# Patient Record
Sex: Female | Born: 1970 | Race: White | Hispanic: No | Marital: Single | Smoking: Never smoker
Health system: Southern US, Community
[De-identification: ages and names within clinical notes are randomized; demographics above are authoritative.]

## PROBLEM LIST (undated history)

## (undated) DIAGNOSIS — K219 Gastro-esophageal reflux disease without esophagitis: Secondary | ICD-10-CM

## (undated) DIAGNOSIS — Z8669 Personal history of other diseases of the nervous system and sense organs: Secondary | ICD-10-CM

---

## 2017-01-31 DIAGNOSIS — R000 Tachycardia, unspecified: Secondary | ICD-10-CM | POA: Diagnosis not present

## 2017-01-31 DIAGNOSIS — R079 Chest pain, unspecified: Secondary | ICD-10-CM | POA: Diagnosis not present

## 2017-01-31 DIAGNOSIS — Z5321 Procedure and treatment not carried out due to patient leaving prior to being seen by health care provider: Secondary | ICD-10-CM | POA: Diagnosis not present

## 2017-01-31 NOTE — ED Notes (Signed)
No response from Pt for vitals re-check at 18:30.

## 2017-01-31 NOTE — ED Triage Notes (Signed)
Pt. Stated, I started having a fast heart rate and some chest pain, went to [facility] and they sent me here.

## 2017-02-01 HISTORY — DX: Personal history of other diseases of the nervous system and sense organs: Z86.69

## 2017-02-01 HISTORY — DX: Gastro-esophageal reflux disease without esophagitis: K21.9

## 2019-05-28 IMAGING — DX DG CHEST 2V
2 series · 2 of 2 positions shown · non-contrast
Comparison: None.

CLINICAL DATA: Left-sided chest pain and heart palpitations.

EXAM:
CHEST  2 VIEW

[w chest pa]
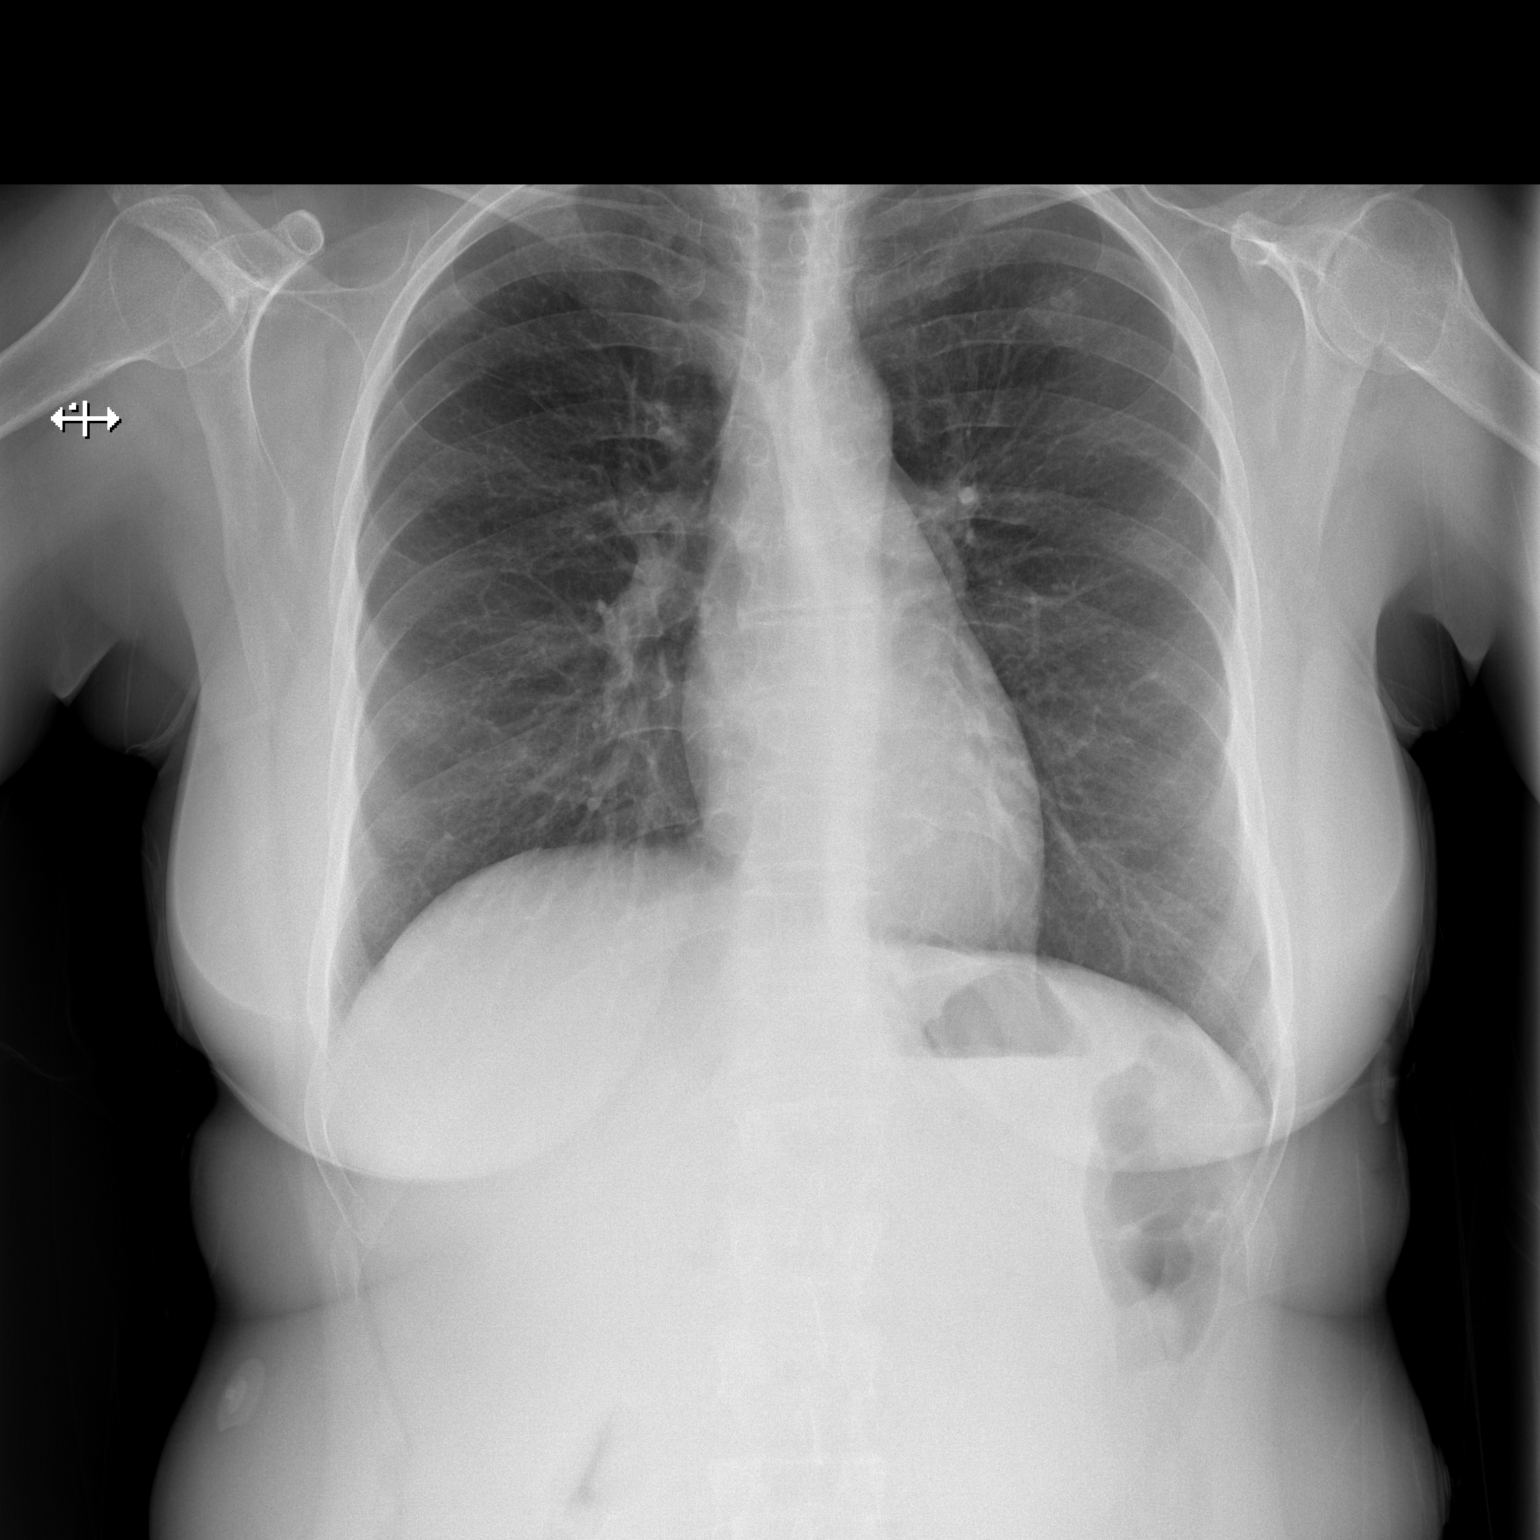

[w chest lat]
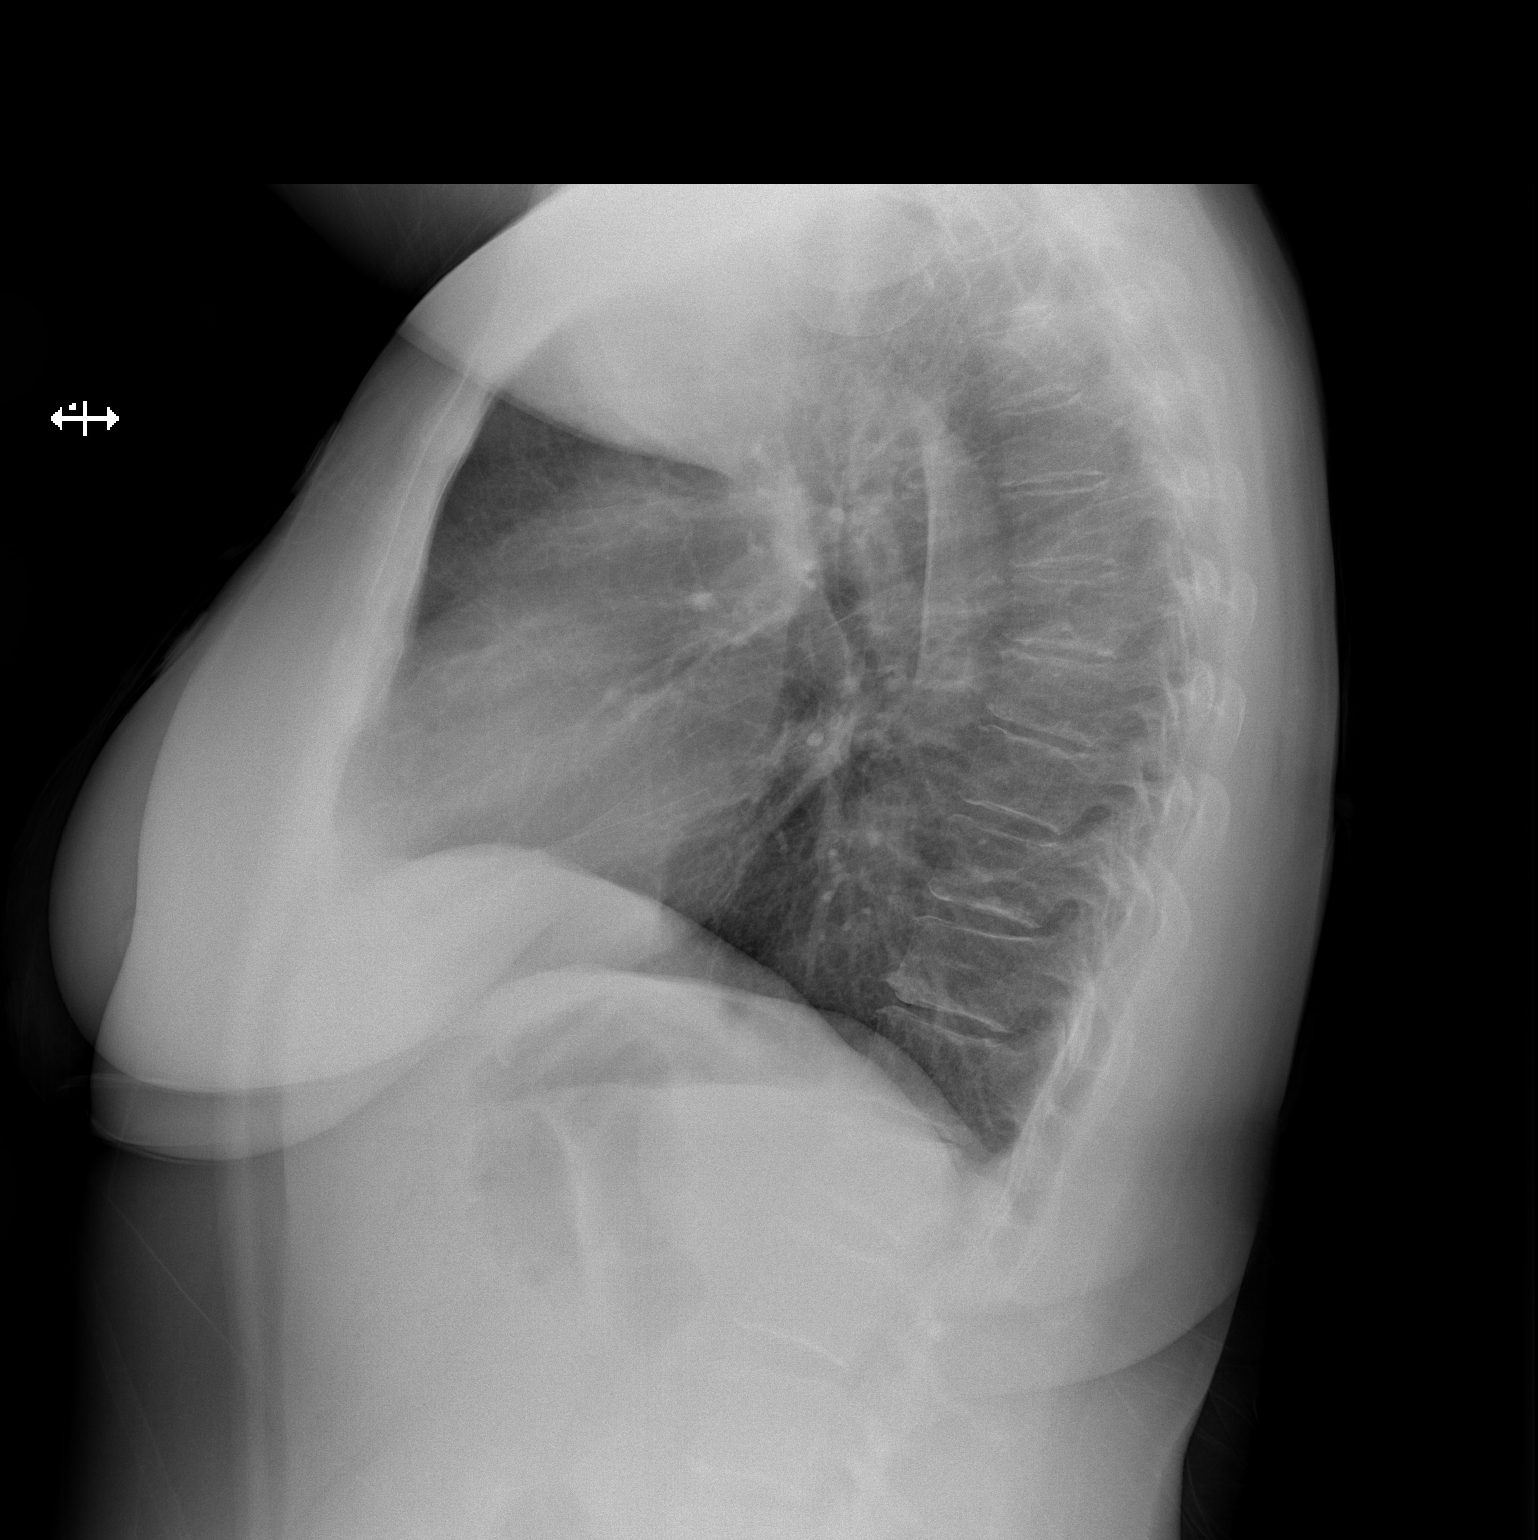

[2 of 2 positions shown; findings below may reference images not displayed]

FINDINGS: The heart size and mediastinal contours are within normal limits.
Lungs are mildly emphysematous but clear. Two tiny nodular densities
project over the left anterior second rib possibly on the surface of
the patient from an EKG lead. Pulmonary nodules measuring 3 mm in
diameter are not entirely excluded. The visualized skeletal
structures are unremarkable.
IMPRESSION: Mildly hyperinflated appearance of the lungs without pneumonic
consolidation or CHF. Two tiny nodular densities associated with a
subtle larger rounded opacity in the left upper lobe likely reflect
a cardiac monitoring lead. Correlate.
